# Patient Record
Sex: Female | Born: 2000 | Race: White | Hispanic: No | Marital: Single
Health system: Southern US, Community
[De-identification: ages and names within clinical notes are randomized; demographics above are authoritative.]

---
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
## 2020-06-30 ENCOUNTER — Ambulatory Visit (INDEPENDENT_AMBULATORY_CARE_PROVIDER_SITE_OTHER): Payer: BLUE CROSS/BLUE SHIELD | Admitting: Family Medicine

## 2020-06-30 ENCOUNTER — Other Ambulatory Visit: Payer: Self-pay

## 2020-06-30 ENCOUNTER — Encounter: Payer: Self-pay | Admitting: Family Medicine

## 2020-06-30 VITALS — BP 110/78 | Ht 63.0 in | Wt 240.0 lb

## 2020-06-30 DIAGNOSIS — M542 Cervicalgia: Secondary | ICD-10-CM

## 2020-06-30 DIAGNOSIS — M545 Low back pain, unspecified: Secondary | ICD-10-CM

## 2020-06-30 DIAGNOSIS — G8929 Other chronic pain: Secondary | ICD-10-CM | POA: Diagnosis not present

## 2020-06-30 NOTE — Patient Instructions (Signed)
This is still likely strain of the deep posture muscles of your back but you're not improving with extensive conservative treatment over the past 5 months so we will go ahead with MRIs of your low back and your neck. Activities as tolerated in the meantime. Continue working with the athletic trainers also. Follow up with me (virtual or in person) for a no charge visit to go over results after the MRIs.

## 2020-06-30 NOTE — Progress Notes (Signed)
PCP: No primary care provider on file.  Subjective:   HPI: Patient is a 20 y.o. female here for back pain.  Patient reports for the past 5 months she's had persistent worsening pain throughout her back. Started in low back near end of her swimming season and has radiated up since that time. No radiation into extremities. No numbness, tingling. No bowel/bladder dysfunction. Been working with athletic trainers doing physical therapy for over a month. Has seen chiropractor and orthopedic with radiographs of cervical spine and lumbar spine - normal except straightening of cervical spine Tried prednisone dose pack without benefit. Meloxicam caused nausea and vomiting. Muscle relaxant only with mild benefit. Pain worse with extension more than flexion.  History reviewed. No pertinent past medical history.  Current Outpatient Medications on File Prior to Visit  Medication Sig Dispense Refill  . Baclofen 5 MG TABS Take 1-2 tablets by mouth every 8 (eight) hours as needed.    Marland Kitchen lisdexamfetamine (VYVANSE) 20 MG capsule Vyvanse 20 mg capsule    . spironolactone (ALDACTONE) 50 MG tablet Take 2 tablet by mouth once a day  per Derm.     No current facility-administered medications on file prior to visit.    History reviewed. No pertinent surgical history.  No Known Allergies  Social History   Socioeconomic History  . Marital status: Single    Spouse name: Not on file  . Number of children: Not on file  . Years of education: Not on file  . Highest education level: Not on file  Occupational History  . Not on file  Tobacco Use  . Smoking status: Not on file  . Smokeless tobacco: Not on file  Substance and Sexual Activity  . Alcohol use: Not on file  . Drug use: Not on file  . Sexual activity: Not on file  Other Topics Concern  . Not on file  Social History Narrative  . Not on file   Social Determinants of Health   Financial Resource Strain: Not on file  Food Insecurity: Not  on file  Transportation Needs: Not on file  Physical Activity: Not on file  Stress: Not on file  Social Connections: Not on file  Intimate Partner Violence: Not on file    History reviewed. No pertinent family history.  BP 110/78   Ht 5\' 3"  (1.6 m)   Wt 240 lb (108.9 kg)   BMI 42.51 kg/m   [facility] 06/30/2020  Frequency of aerobic exercise (# of days/week) 3  Average time in minutes 90  Frequency of strengthening activities (# of days/week) 0    No flowsheet data found.  Review of Systems: See HPI above.     Objective:  Physical Exam:  Gen: NAD, comfortable in exam room  Neck: No gross deformity, swelling, bruising. TTP bilateral paraspinal regions.  No midline/bony TTP. FROM with pain on extension, left lateral rotation. BUE strength 5/5.   Sensation intact to light touch.   Trace equal reflexes in triceps, biceps, brachioradialis tendons. Negative spurlings. NV intact distal BUEs.  Back: No gross deformity, scoliosis. TTP bilateral paraspinal lumbar regions.  No midline or bony TTP. FROM with pain on extension. Strength LEs 5/5 all muscle groups.   Trace MSRs in patellar and achilles tendons, equal bilaterally. Negative SLRs. Sensation intact to light touch bilaterally. Pain with bilateral storks. Negative logroll bilateral hips.   Assessment & Plan:  1. Neck pain - unfortunately not improving with extensive physical therapy including over a month  of physical therapy, prednisone dose pack, nsaids, muscle relaxants.  Will go ahead with MRI to further assess.  2. Low back pain - unfortunately not improving with extensive physical therapy including over a month of physical therapy, prednisone dose pack, nsaids, muscle relaxants.  Will go ahead with MRI to further assess.

## 2020-07-15 ENCOUNTER — Ambulatory Visit
Admission: RE | Admit: 2020-07-15 | Discharge: 2020-07-15 | Disposition: A | Discharge status: Home / Self Care | Payer: BLUE CROSS/BLUE SHIELD | Source: Ambulatory Visit | Attending: Family Medicine | Admitting: Family Medicine

## 2020-07-15 ENCOUNTER — Other Ambulatory Visit: Payer: Self-pay

## 2020-07-15 DIAGNOSIS — G8929 Other chronic pain: Secondary | ICD-10-CM

## 2020-07-15 DIAGNOSIS — M542 Cervicalgia: Secondary | ICD-10-CM

## 2020-07-27 ENCOUNTER — Telehealth (INDEPENDENT_AMBULATORY_CARE_PROVIDER_SITE_OTHER): Payer: BLUE CROSS/BLUE SHIELD | Admitting: Family Medicine

## 2020-07-27 ENCOUNTER — Encounter: Payer: Self-pay | Admitting: Family Medicine

## 2020-07-27 VITALS — Ht 63.0 in | Wt 240.0 lb

## 2020-07-27 DIAGNOSIS — M545 Low back pain, unspecified: Secondary | ICD-10-CM

## 2020-07-27 DIAGNOSIS — G8929 Other chronic pain: Secondary | ICD-10-CM

## 2020-07-27 NOTE — Progress Notes (Signed)
MRIs reviewed and discussed with patient.  Both were normal which is reassuring - consistent with suspicion she has deep posture muscle strain that just isn't improving with conservative measures to date.  We discussed options (see prior note for what she's tried to date).  Recommended chiropractic care - information provided.  F/u with [place] or in training room in 6 weeks.

## 2022-07-11 IMAGING — MR MR CERVICAL SPINE W/O CM
5 series · 36 of 48 positions shown · non-contrast
Comparison: None.

CLINICAL DATA: Chronic neck pain.  Pain for 5 months.

EXAM:
MRI CERVICAL AND LUMBAR SPINE WITHOUT CONTRAST
TECHNIQUE: Multiplanar and multiecho pulse sequences of the cervical spine, to
include the craniocervical junction and cervicothoracic junction,
and lumbar spine, were obtained without intravenous contrast.

[Series 2: T2 · sagittal · 3.0mm · 0.41mm/px · 8 of 19 slices shown (1 of 2)]
[im 1/19]
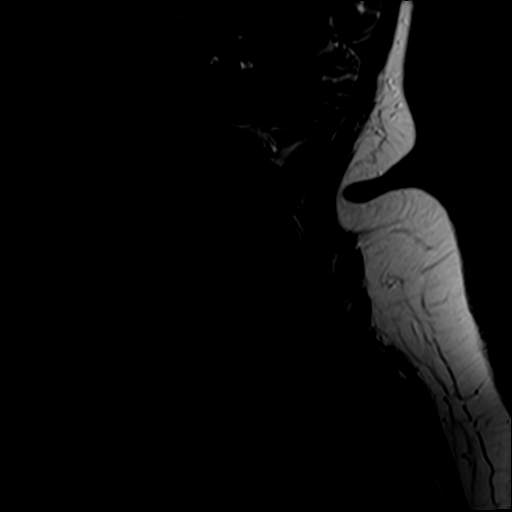
[im 3/19]
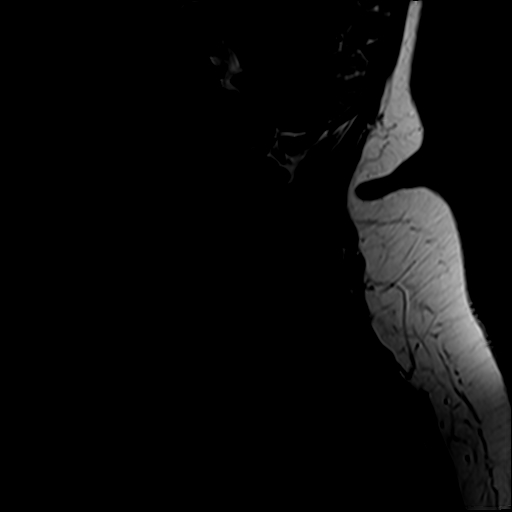
[im 6/19]
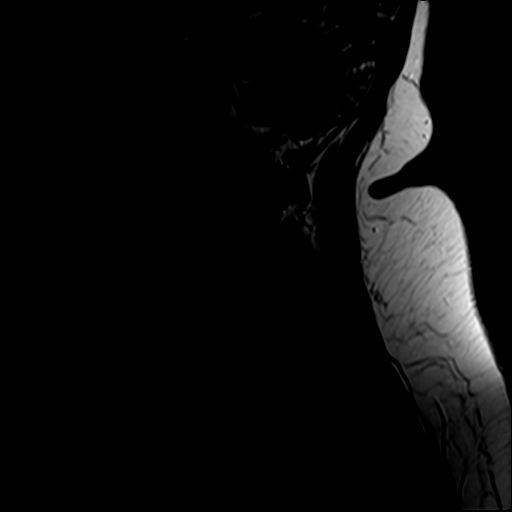
[im 8/19]
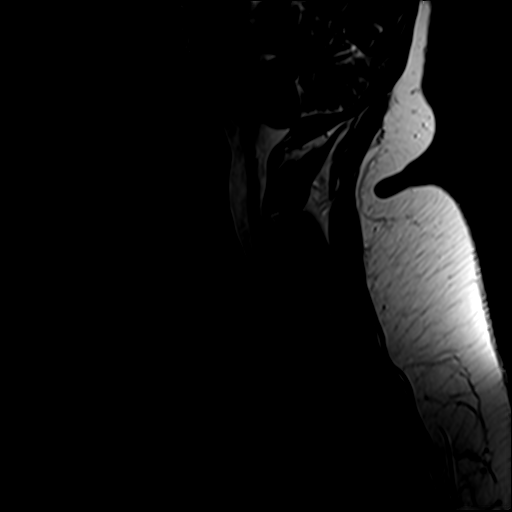
[im 11/19]
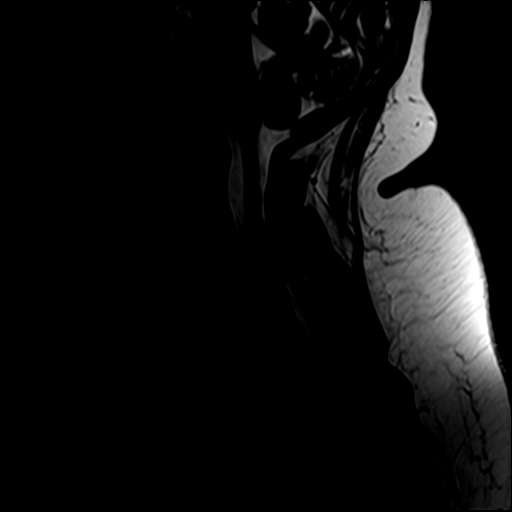
[im 13/19]
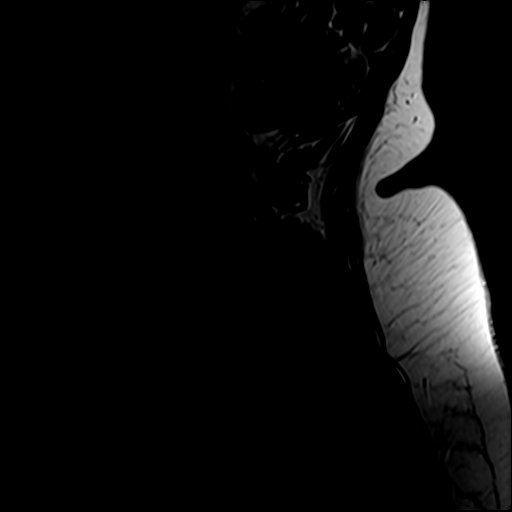
[im 16/19]
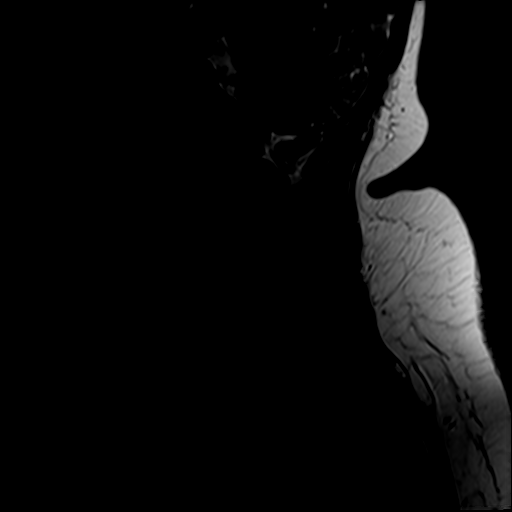
[im 19/19]
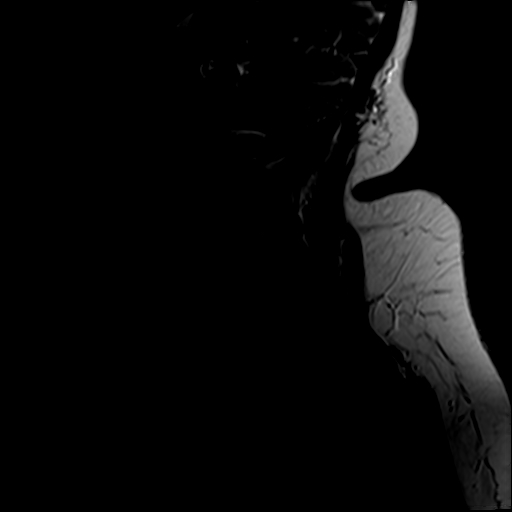

[Series 3: STIR · sagittal · 3.0mm · 0.82mm/px · 8 of 19 slices shown]
[im 1/19]
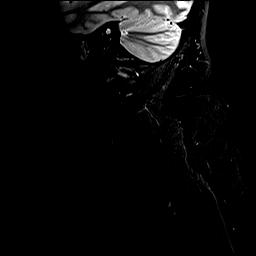
[im 3/19]
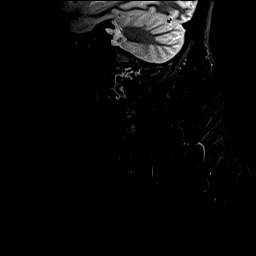
[im 6/19]
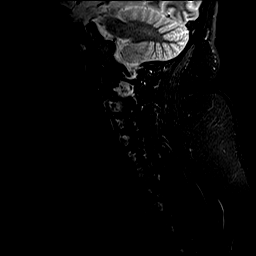
[im 8/19]
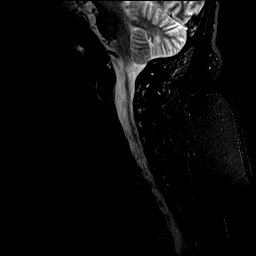
[im 11/19]
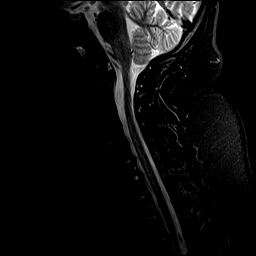
[im 13/19]
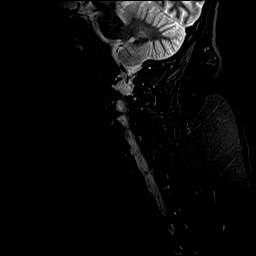
[im 16/19]
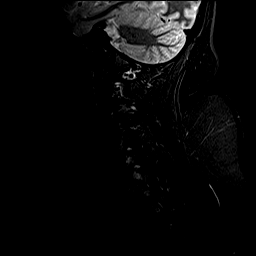
[im 19/19]
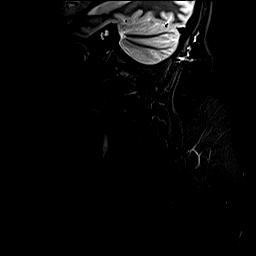

[Series 4: T1 · sagittal · 3.0mm · 0.82mm/px · 8 of 19 slices shown]
[im 1/19]
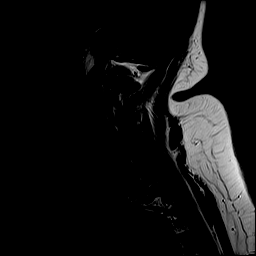
[im 3/19]
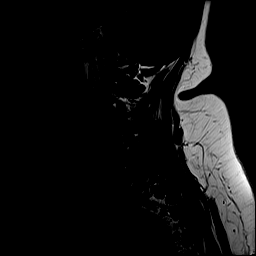
[im 6/19]
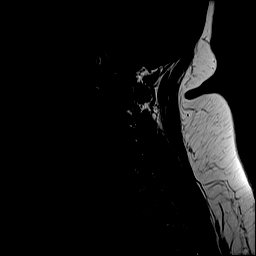
[im 8/19]
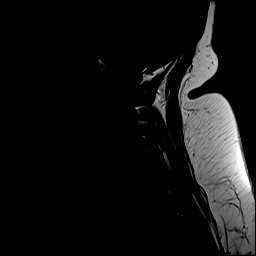
[im 11/19]
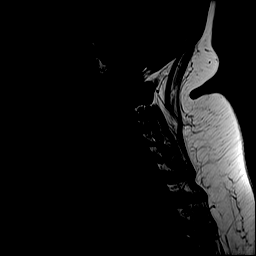
[im 13/19]
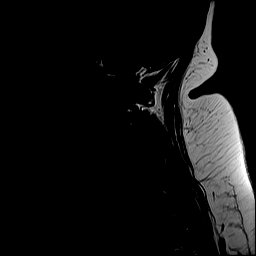
[im 16/19]
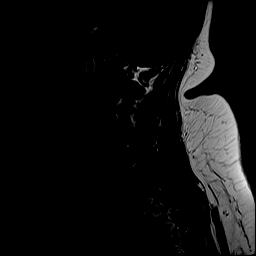
[im 19/19]
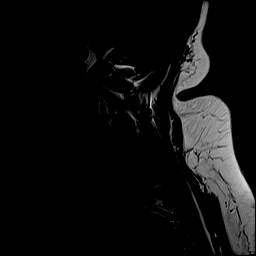

[Series 5: T2 · axial · 3.0mm · 0.70mm/px · z∈[-97,+9]mm · 9 of 30 slices shown (2 of 2)]
[im 1/30]
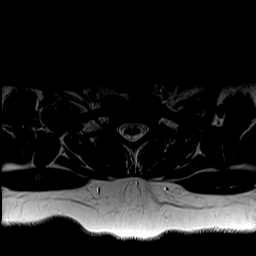
[im 6/30]
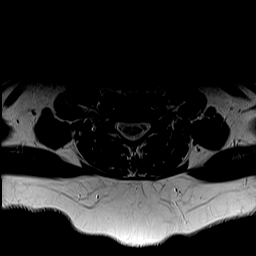
[im 8/30]
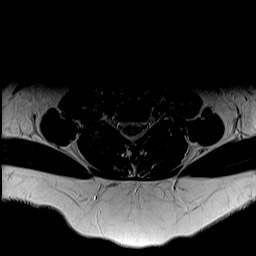
[im 14/30]
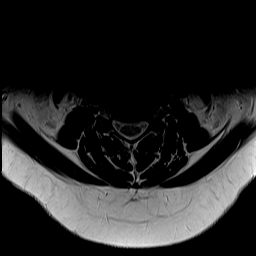
[im 16/30]
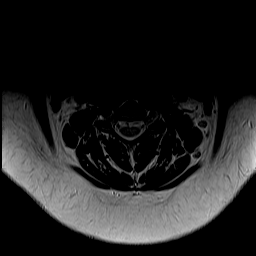
[im 22/30]
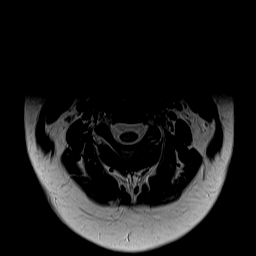
[im 24/30]
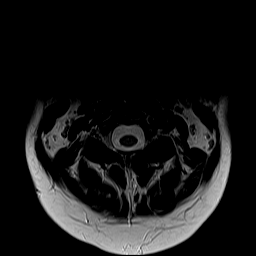
[im 27/30]
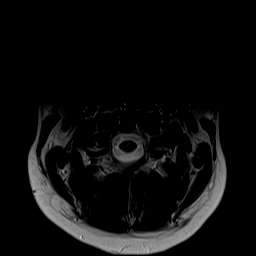
[im 30/30]
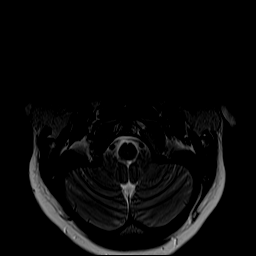

[Series 6: GRE · axial · 3.0mm · 0.35mm/px · z∈[-97,-71]mm · 3 of 30 slices shown]
[im 1/30]
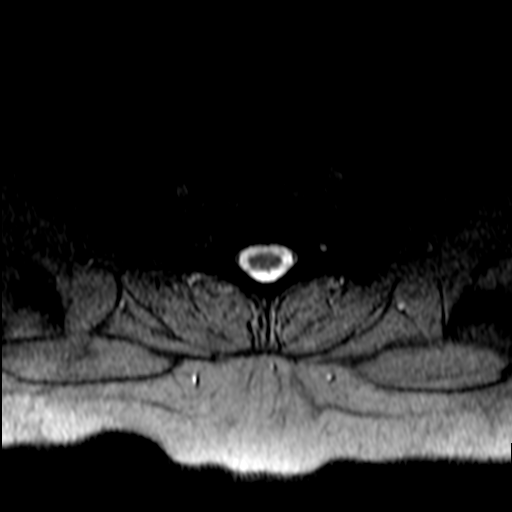
[im 6/30]
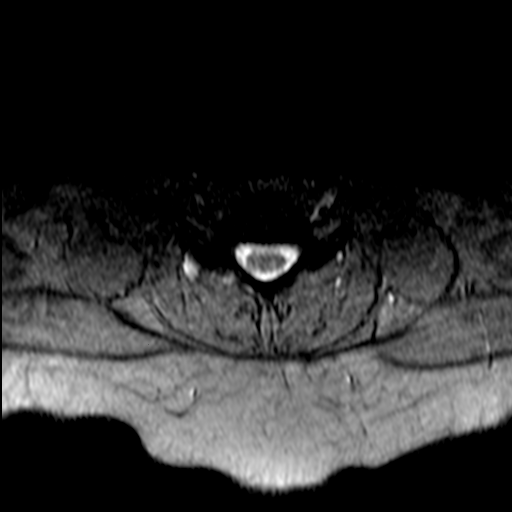
[im 8/30]
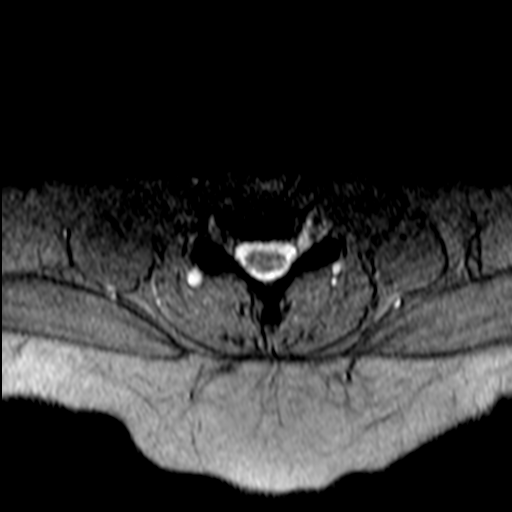

[36 of 48 positions shown; findings below may reference images not displayed]

FINDINGS: MRI CERVICAL SPINE FINDINGS

Alignment: Physiologic.

Vertebrae: No fracture, evidence of discitis, or bone lesion.

Cord: Normal signal and morphology.

Posterior Fossa, vertebral arteries, paraspinal tissues: Negative

Disc levels:

Well preserved disc height and hydration diffusely. No facet
spurring or neural impingement.

MRI LUMBAR SPINE FINDINGS

Segmentation: Standard lumbar numbering based on the available
coverage.

Alignment:  Borderline retrolisthesis at L5-S1.

Vertebrae:  No fracture, evidence of discitis, or bone lesion.

Conus medullaris and cauda equina: Conus extends to the L1 level.
Conus and cauda equina appear normal.

Paraspinal and other soft tissues: Negative

Disc levels:

Well preserved disc height and hydration. No facet spurring or
neural impingement.
IMPRESSION: Negative cervical and lumbar MRI. No inflammation or impingement to
explain symptoms.
